# Patient Record
Sex: Female | Born: 1995 | Race: Black or African American | Hispanic: No | Marital: Single | Smoking: Never smoker
Health system: Southern US, Community
[De-identification: ages and names within clinical notes are randomized; demographics above are authoritative.]

## PROBLEM LIST (undated history)

## (undated) DIAGNOSIS — Z789 Other specified health status: Secondary | ICD-10-CM

## (undated) HISTORY — PX: NO PAST SURGERIES: SHX2092

---
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
## 2015-12-23 LAB — OB RESULTS CONSOLE VARICELLA ZOSTER ANTIBODY, IGG: VARICELLA IGG: IMMUNE

## 2015-12-23 LAB — OB RESULTS CONSOLE RUBELLA ANTIBODY, IGM: RUBELLA: IMMUNE

## 2015-12-23 LAB — OB RESULTS CONSOLE RPR: RPR: NONREACTIVE

## 2015-12-23 LAB — OB RESULTS CONSOLE HIV ANTIBODY (ROUTINE TESTING): HIV: NONREACTIVE

## 2016-03-29 LAB — OB RESULTS CONSOLE GC/CHLAMYDIA: CHLAMYDIA, DNA PROBE: POSITIVE

## 2016-05-15 NOTE — L&D Delivery Note (Signed)
Delivery Note At 4:13 PM a viable female was delivered via Vaginal, Spontaneous Delivery (Presentation:vertex ; LOA ).  APGAR:8 ,9 ; weight  .   Placenta status:spont ,via shultz .  Cord:3 vc  with the following complications:none .  Cord pH: n/a  Anesthesia:  none Episiotomy: None Lacerations: None Suture Repair: n/a Est. Blood Loss (mL): 100  Mom to postpartum.  Baby to Couplet care / Skin to Skin.  Wyvonnia DuskyMarie Tunisia Landgrebe 07/16/2016, 4:32 PM

## 2016-07-15 ENCOUNTER — Inpatient Hospital Stay (HOSPITAL_COMMUNITY): Payer: Medicaid Other

## 2016-07-15 ENCOUNTER — Encounter (HOSPITAL_COMMUNITY): Payer: Self-pay

## 2016-07-15 ENCOUNTER — Inpatient Hospital Stay (HOSPITAL_COMMUNITY)
Admission: AD | Admit: 2016-07-15 | Discharge: 2016-07-18 | DRG: 774 | Disposition: A | Discharge status: Home / Self Care | Payer: Medicaid Other | Source: Ambulatory Visit | Attending: Obstetrics and Gynecology | Admitting: Obstetrics and Gynecology

## 2016-07-15 DIAGNOSIS — O4693 Antepartum hemorrhage, unspecified, third trimester: Secondary | ICD-10-CM

## 2016-07-15 DIAGNOSIS — Z3A37 37 weeks gestation of pregnancy: Secondary | ICD-10-CM

## 2016-07-15 DIAGNOSIS — O9832 Other infections with a predominantly sexual mode of transmission complicating childbirth: Secondary | ICD-10-CM | POA: Diagnosis present

## 2016-07-15 DIAGNOSIS — O99824 Streptococcus B carrier state complicating childbirth: Secondary | ICD-10-CM | POA: Diagnosis present

## 2016-07-15 DIAGNOSIS — O4593 Premature separation of placenta, unspecified, third trimester: Principal | ICD-10-CM | POA: Diagnosis present

## 2016-07-15 DIAGNOSIS — A568 Sexually transmitted chlamydial infection of other sites: Secondary | ICD-10-CM | POA: Diagnosis present

## 2016-07-15 HISTORY — DX: Other specified health status: Z78.9

## 2016-07-15 LAB — URINALYSIS, ROUTINE W REFLEX MICROSCOPIC
BILIRUBIN URINE: NEGATIVE
Glucose, UA: NEGATIVE mg/dL
KETONES UR: NEGATIVE mg/dL
Nitrite: NEGATIVE
PH: 6 (ref 5.0–8.0)
Protein, ur: 30 mg/dL — AB
Specific Gravity, Urine: 1.027 (ref 1.005–1.030)

## 2016-07-15 LAB — TYPE AND SCREEN
ABO/RH(D): B POS
Antibody Screen: NEGATIVE

## 2016-07-15 LAB — CBC
HCT: 31.3 % — ABNORMAL LOW (ref 36.0–46.0)
Hemoglobin: 10.7 g/dL — ABNORMAL LOW (ref 12.0–15.0)
MCH: 30.7 pg (ref 26.0–34.0)
MCHC: 34.2 g/dL (ref 30.0–36.0)
MCV: 89.7 fL (ref 78.0–100.0)
Platelets: 192 10*3/uL (ref 150–400)
RBC: 3.49 MIL/uL — ABNORMAL LOW (ref 3.87–5.11)
RDW: 13 % (ref 11.5–15.5)
WBC: 7.2 10*3/uL (ref 4.0–10.5)

## 2016-07-15 LAB — WET PREP, GENITAL
Clue Cells Wet Prep HPF POC: NONE SEEN
SPERM: NONE SEEN
Trich, Wet Prep: NONE SEEN
YEAST WET PREP: NONE SEEN

## 2016-07-15 LAB — POCT FERN TEST: POCT FERN TEST: NEGATIVE

## 2016-07-15 MED ORDER — PENICILLIN G POTASSIUM 5000000 UNITS IJ SOLR
5.0000 10*6.[IU] | Freq: Once | INTRAVENOUS | Status: AC
  Administered 2016-07-15: 5 10*6.[IU] via INTRAVENOUS
  Filled 2016-07-15: qty 5

## 2016-07-15 MED ORDER — OXYTOCIN 40 UNITS IN LACTATED RINGERS INFUSION - SIMPLE MED: 2.5000 [IU]/h | INTRAVENOUS | Status: DC

## 2016-07-15 MED ORDER — ACETAMINOPHEN 325 MG PO TABS: 650.0000 mg | ORAL_TABLET | ORAL | Status: DC | PRN

## 2016-07-15 MED ORDER — SOD CITRATE-CITRIC ACID 500-334 MG/5ML PO SOLN: 30.0000 mL | ORAL | Status: DC | PRN

## 2016-07-15 MED ORDER — LACTATED RINGERS IV SOLN: 500.0000 mL | INTRAVENOUS | Status: DC | PRN

## 2016-07-15 MED ORDER — OXYTOCIN 40 UNITS IN LACTATED RINGERS INFUSION - SIMPLE MED
1.0000 m[IU]/min | INTRAVENOUS | Status: DC
  Administered 2016-07-15: 2 m[IU]/min via INTRAVENOUS
  Filled 2016-07-15: qty 1000

## 2016-07-15 MED ORDER — OXYCODONE-ACETAMINOPHEN 5-325 MG PO TABS: 1.0000 | ORAL_TABLET | ORAL | Status: DC | PRN

## 2016-07-15 MED ORDER — OXYCODONE-ACETAMINOPHEN 5-325 MG PO TABS: 2.0000 | ORAL_TABLET | ORAL | Status: DC | PRN

## 2016-07-15 MED ORDER — PENICILLIN G POT IN DEXTROSE 60000 UNIT/ML IV SOLN
3.0000 10*6.[IU] | INTRAVENOUS | Status: DC
  Administered 2016-07-16 (×4): 3 10*6.[IU] via INTRAVENOUS
  Filled 2016-07-15 (×7): qty 50

## 2016-07-15 MED ORDER — ONDANSETRON HCL 4 MG/2ML IJ SOLN: 4.0000 mg | Freq: Four times a day (QID) | INTRAMUSCULAR | Status: DC | PRN

## 2016-07-15 MED ORDER — TERBUTALINE SULFATE 1 MG/ML IJ SOLN
0.2500 mg | Freq: Once | INTRAMUSCULAR | Status: DC | PRN
  Filled 2016-07-15: qty 1

## 2016-07-15 MED ORDER — OXYTOCIN BOLUS FROM INFUSION
500.0000 mL | Freq: Once | INTRAVENOUS | Status: AC
  Administered 2016-07-16: 500 mL via INTRAVENOUS

## 2016-07-15 MED ORDER — LACTATED RINGERS IV SOLN
INTRAVENOUS | Status: DC
  Administered 2016-07-16: 06:00:00 via INTRAVENOUS

## 2016-07-15 MED ORDER — LIDOCAINE HCL (PF) 1 % IJ SOLN
30.0000 mL | INTRAMUSCULAR | Status: DC | PRN
  Filled 2016-07-15: qty 30

## 2016-07-15 NOTE — MAU Provider Note (Signed)
History     CSN: [id number]  Arrival date and time: 07/15/16 1831   First Provider Initiated Contact with Patient 07/15/16 1904      Chief Complaint  Patient presents with  . Abdominal Cramping  . Vaginal Bleeding   HPI Beth Dixon is a 21 y.o. G1P0 at [redacted]w[redacted]d who presents with vaginal bleeding. Reports vaginal bleeding like a period with some small clot that began ~1 hour PTA. Bleeding began immediately after intercourse. Gets prenatal care in [place] -- states her mother is en route with her prenatal records. Endorses some abdominal cramping & pelvic pressure with walking. Positive fetal movement. Denies complications with the pregnancy.   OB History    Gravida Para Term Preterm AB Living   1             SAB TAB Ectopic Multiple Live Births                  Past Medical History:  Diagnosis Date  . Medical history non-contributory     Past Surgical History:  Procedure Laterality Date  . NO PAST SURGERIES      History reviewed. No pertinent family history.  Social History  Substance Use Topics  . Smoking status: Never Smoker  . Smokeless tobacco: Never Used  . Alcohol use No    Allergies: No Known Allergies  Prescriptions Prior to Admission  Medication Sig Dispense Refill Last Dose  . cephALEXin (KEFLEX) 500 MG capsule Take 1 capsule (500 mg total) by mouth 4 (four) times daily. 40 capsule 0   . naproxen sodium (ANAPROX) 220 MG tablet Take 220 mg by mouth 2 (two) times daily as needed (for pain).   06/29/2014 at Unknown time  . ondansetron (ZOFRAN) 4 MG tablet Take 1 tablet (4 mg total) by mouth every 6 (six) hours. 12 tablet 0     Review of Systems  Constitutional: Negative.   Gastrointestinal: Positive for abdominal pain.  Genitourinary: Positive for vaginal bleeding. Negative for dyspareunia and dysuria.   Physical Exam   Blood pressure 128/82, pulse 98, temperature 98.6 F (37 C), temperature source Oral, resp. rate 18, height 5\' 3"  (1.6 m), weight 168  lb (76.2 kg).  Physical Exam  Nursing note and vitals reviewed. Constitutional: She is oriented to person, place, and time. She appears well-developed and well-nourished. No distress.  HENT:  Head: Normocephalic and atraumatic.  Eyes: Conjunctivae are normal. Right eye exhibits no discharge. Left eye exhibits no discharge. No scleral icterus.  Neck: Normal range of motion.  Respiratory: Effort normal. No respiratory distress.  GI: Soft. There is no tenderness.  Genitourinary: Cervix exhibits discharge (small amount of tan mucoid discharge) and friability. No bleeding in the vagina.  Genitourinary Comments: SVE deferred until ultrasound results  Neurological: She is alert and oriented to person, place, and time.  Skin: Skin is warm and dry. She is not diaphoretic.  Psychiatric: She has a normal mood and affect. Her behavior is normal. Judgment and thought content normal.   Fetal Tracing:  Baseline: 135 Variability: moderate Accelerations: 15x15 Decelerations: none  Toco: irr ctx MAU Course  Procedures Results for orders placed or performed during the hospital encounter of 07/15/16 (from the past 24 hour(s))  Urinalysis, Routine w reflex microscopic     Status: Abnormal   Collection Time: 07/15/16  6:38 PM  Result Value Ref Range   Color, Urine YELLOW YELLOW   APPearance HAZY (A) CLEAR   Specific Gravity, Urine 1.027 1.005 - 1.030  pH 6.0 5.0 - 8.0   Glucose, UA NEGATIVE NEGATIVE mg/dL   Hgb urine dipstick MODERATE (A) NEGATIVE   Bilirubin Urine NEGATIVE NEGATIVE   Ketones, ur NEGATIVE NEGATIVE mg/dL   Protein, ur 30 (A) NEGATIVE mg/dL   Nitrite NEGATIVE NEGATIVE   Leukocytes, UA LARGE (A) NEGATIVE   RBC / HPF 6-30 0 - 5 RBC/hpf   WBC, UA 6-30 0 - 5 WBC/hpf   Bacteria, UA RARE (A) NONE SEEN   Squamous Epithelial / LPF 6-30 (A) NONE SEEN   Mucous PRESENT   CBC     Status: Abnormal   Collection Time: 07/15/16  7:00 PM  Result Value Ref Range   WBC 7.2 4.0 - 10.5 K/uL    RBC 3.49 (L) 3.87 - 5.11 MIL/uL   Hemoglobin 10.7 (L) 12.0 - 15.0 g/dL   HCT 91.431.3 (L) 78.236.0 - 95.646.0 %   MCV 89.7 78.0 - 100.0 fL   MCH 30.7 26.0 - 34.0 pg   MCHC 34.2 30.0 - 36.0 g/dL   RDW 21.313.0 08.611.5 - 57.815.5 %   Platelets 192 150 - 400 K/uL  POCT fern test     Status: None   Collection Time: 07/15/16  7:36 PM  Result Value Ref Range   POCT Fern Test Negative = intact amniotic membranes     MDM CBC, type & screen, HIV, RPR, GC/CT, wet prep Pt in ultrasound to assess placenta Fern negative Care turned over to [facility] Lynleigh Kovack CNM            Judeth HornErin Lawrence, NP 07/15/2016 7:34 PM  2022- US nml, SVE 5/70/-2, reports worsening pain, likely early active labor, will admit Assessment and Plan  37.[redacted] weeks gestation GBS positive Admit to BS PCN  Donette LarryMelanie Cleven Jansma, CNM  07/15/2016 8:24 PM

## 2016-07-15 NOTE — MAU Note (Signed)
[place] in [place], onset of bleeding like a period about one hour ago, and cramping when walking.

## 2016-07-15 NOTE — H&P (Signed)
LABOR AND DELIVERY ADMISSION HISTORY AND PHYSICAL NOTE  Beth Dixon is a 21 y.o. female G1P0 with IUP at [redacted]w[redacted]d by LMP confirmed with Ultrasound presenting for vaginal bleeding. She presented to MAU with significant periord like bleeding for 1-2 hours following intercourse.This was associated with some cramping. Upon arrival to MAU she had an exam which revealed a friable cervix. dilated to 5cm Records where reviewed and they where unremarkable.   She reports positive fetal movement. She denies leakage of fluid or vaginal bleeding.  Prenatal History/Complications:  Past Medical History: Past Medical History:  Diagnosis Date  . Medical history non-contributory     Past Surgical History: Past Surgical History:  Procedure Laterality Date  . NO PAST SURGERIES      Obstetrical History: OB History    Gravida Para Term Preterm AB Living   1             SAB TAB Ectopic Multiple Live Births                  Social History: Social History   Social History  . Marital status: Single    Spouse name: N/A  . Number of children: N/A  . Years of education: N/A   Social History Main Topics  . Smoking status: Never Smoker  . Smokeless tobacco: Never Used  . Alcohol use No  . Drug use: No  . Sexual activity: Yes    Birth control/ protection: None   Other Topics Concern  . None   Social History Narrative  . None    Family History: History reviewed. No pertinent family history.  Allergies: No Known Allergies  Prescriptions Prior to Admission  Medication Sig Dispense Refill Last Dose  . cephALEXin (KEFLEX) 500 MG capsule Take 1 capsule (500 mg total) by mouth 4 (four) times daily. 40 capsule 0   . naproxen sodium (ANAPROX) 220 MG tablet Take 220 mg by mouth 2 (two) times daily as needed (for pain).   06/29/2014 at Unknown time  . ondansetron (ZOFRAN) 4 MG tablet Take 1 tablet (4 mg total) by mouth every 6 (six) hours. 12 tablet 0      Review of Systems   All systems  reviewed and negative except as stated in HPI  Blood pressure (!) 142/89, pulse 88, temperature 98.4 F (36.9 C), temperature source Oral, resp. rate 18, height 5\' 3"  (1.6 m), weight 168 lb (76.2 kg). General appearance: alert, cooperative and appears stated age Lungs: clear to auscultation bilaterally Heart: regular rate and rhythm Abdomen: soft, non-tender; bowel sounds normal Extremities: No calf swelling or tenderness Presentation: cephalic nursing exam Fetal monitoring: category 1  Uterine activity: irregular contractions Dilation: 5 Effacement (%): 70 Station: -2 Exam by:: Donette Larry CNM   Prenatal labs: ABO, Rh: --/--/B POS (03/03 1900) Antibody: NEG (03/03 1900) Rubella: immune RPR: Nonreactive (08/10 0000)  HBsAg:   negative HIV: Non-reactive (08/10 0000)  GBS:   positive 1 hr Glucola: normal Genetic screening:  noral Anatomy US: normal  Prenatal Transfer Tool  Maternal Diabetes: No Genetic Screening: Normal Maternal Ultrasounds/Referrals: Normal Fetal Ultrasounds or other Referrals:  None Maternal Substance Abuse:  No Significant Maternal Medications:  None Significant Maternal Lab Results: Lab values include: Group B Strep positive  Results for orders placed or performed during the hospital encounter of 07/15/16 (from the past 24 hour(s))  Urinalysis, Routine w reflex microscopic   Collection Time: 07/15/16  6:38 PM  Result Value Ref Range   Color, Urine  YELLOW YELLOW   APPearance HAZY (A) CLEAR   Specific Gravity, Urine 1.027 1.005 - 1.030   pH 6.0 5.0 - 8.0   Glucose, UA NEGATIVE NEGATIVE mg/dL   Hgb urine dipstick MODERATE (A) NEGATIVE   Bilirubin Urine NEGATIVE NEGATIVE   Ketones, ur NEGATIVE NEGATIVE mg/dL   Protein, ur 30 (A) NEGATIVE mg/dL   Nitrite NEGATIVE NEGATIVE   Leukocytes, UA LARGE (A) NEGATIVE   RBC / HPF 6-30 0 - 5 RBC/hpf   WBC, UA 6-30 0 - 5 WBC/hpf   Bacteria, UA RARE (A) NONE SEEN   Squamous Epithelial / LPF 6-30 (A) NONE  SEEN   Mucous PRESENT   CBC   Collection Time: 07/15/16  7:00 PM  Result Value Ref Range   WBC 7.2 4.0 - 10.5 K/uL   RBC 3.49 (L) 3.87 - 5.11 MIL/uL   Hemoglobin 10.7 (L) 12.0 - 15.0 g/dL   HCT 78.231.3 (L) 95.636.0 - 21.346.0 %   MCV 89.7 78.0 - 100.0 fL   MCH 30.7 26.0 - 34.0 pg   MCHC 34.2 30.0 - 36.0 g/dL   RDW 08.613.0 57.811.5 - 46.915.5 %   Platelets 192 150 - 400 K/uL  Type and screen   Collection Time: 07/15/16  7:00 PM  Result Value Ref Range   ABO/RH(D) B POS    Antibody Screen NEG    Sample Expiration 07/18/2016   Wet prep, genital   Collection Time: 07/15/16  7:14 PM  Result Value Ref Range   Yeast Wet Prep HPF POC NONE SEEN NONE SEEN   Trich, Wet Prep NONE SEEN NONE SEEN   Clue Cells Wet Prep HPF POC NONE SEEN NONE SEEN   WBC, Wet Prep HPF POC MODERATE (A) NONE SEEN   Sperm NONE SEEN   POCT fern test   Collection Time: 07/15/16  7:36 PM  Result Value Ref Range   POCT Fern Test Negative = intact amniotic membranes     Patient Active Problem List   Diagnosis Date Noted  . Indication for care in labor or delivery 07/15/2016    Assessment: Beth Dixon is a 21 y.o. G1P0 at 4261w4d here for Induction of labor for presumed abruption.  #Labor: will induce with pitocin. Patient with significant bleeding of unknown source. Concern for abruption. #Pain: Plans for epidural #FWB: Category 1 #ID:  GBS pos- PCN #MOF: breast #Circ:  outpatient  Ernestina Pennaicholas Jamael Hoffmann 07/15/2016, 11:41 PM

## 2016-07-16 ENCOUNTER — Encounter (HOSPITAL_COMMUNITY): Payer: Self-pay

## 2016-07-16 DIAGNOSIS — O4593 Premature separation of placenta, unspecified, third trimester: Secondary | ICD-10-CM

## 2016-07-16 DIAGNOSIS — O99824 Streptococcus B carrier state complicating childbirth: Secondary | ICD-10-CM

## 2016-07-16 DIAGNOSIS — Z3A37 37 weeks gestation of pregnancy: Secondary | ICD-10-CM

## 2016-07-16 LAB — HIV ANTIBODY (ROUTINE TESTING W REFLEX): HIV Screen 4th Generation wRfx: NONREACTIVE

## 2016-07-16 LAB — ABO/RH: ABO/RH(D): B POS

## 2016-07-16 LAB — RPR: RPR Ser Ql: NONREACTIVE

## 2016-07-16 MED ORDER — SODIUM CHLORIDE 0.9% FLUSH: 3.0000 mL | INTRAVENOUS | Status: DC | PRN

## 2016-07-16 MED ORDER — SIMETHICONE 80 MG PO CHEW: 80.0000 mg | CHEWABLE_TABLET | ORAL | Status: DC | PRN

## 2016-07-16 MED ORDER — ONDANSETRON HCL 4 MG PO TABS: 4.0000 mg | ORAL_TABLET | ORAL | Status: DC | PRN

## 2016-07-16 MED ORDER — ONDANSETRON HCL 4 MG/2ML IJ SOLN: 4.0000 mg | INTRAMUSCULAR | Status: DC | PRN

## 2016-07-16 MED ORDER — PRENATAL MULTIVITAMIN CH
1.0000 | ORAL_TABLET | Freq: Every day | ORAL | Status: DC
  Administered 2016-07-17: 1 via ORAL
  Filled 2016-07-16: qty 1

## 2016-07-16 MED ORDER — ZOLPIDEM TARTRATE 5 MG PO TABS: 5.0000 mg | ORAL_TABLET | Freq: Every evening | ORAL | Status: DC | PRN

## 2016-07-16 MED ORDER — TETANUS-DIPHTH-ACELL PERTUSSIS 5-2.5-18.5 LF-MCG/0.5 IM SUSP: 0.5000 mL | Freq: Once | INTRAMUSCULAR | Status: DC

## 2016-07-16 MED ORDER — NAPHAZOLINE-GLYCERIN 0.012-0.2 % OP SOLN: 2.0000 [drp] | Freq: Four times a day (QID) | OPHTHALMIC | Status: DC | PRN

## 2016-07-16 MED ORDER — DIPHENHYDRAMINE HCL 25 MG PO CAPS: 25.0000 mg | ORAL_CAPSULE | Freq: Four times a day (QID) | ORAL | Status: DC | PRN

## 2016-07-16 MED ORDER — BENZOCAINE-MENTHOL 20-0.5 % EX AERO
1.0000 "application " | INHALATION_SPRAY | CUTANEOUS | Status: DC | PRN
  Administered 2016-07-16 – 2016-07-18 (×2): 1 via TOPICAL
  Filled 2016-07-16: qty 728
  Filled 2016-07-16: qty 56

## 2016-07-16 MED ORDER — POLYVINYL ALCOHOL 1.4 % OP SOLN
2.0000 [drp] | OPHTHALMIC | Status: DC | PRN
  Administered 2016-07-16: 2 [drp] via OPHTHALMIC
  Filled 2016-07-16: qty 15

## 2016-07-16 MED ORDER — COCONUT OIL OIL
1.0000 "application " | TOPICAL_OIL | Status: DC | PRN
  Administered 2016-07-17: 1 via TOPICAL
  Filled 2016-07-16 (×2): qty 120

## 2016-07-16 MED ORDER — DIBUCAINE 1 % RE OINT: 1.0000 "application " | TOPICAL_OINTMENT | RECTAL | Status: DC | PRN

## 2016-07-16 MED ORDER — SODIUM CHLORIDE 0.9% FLUSH: 3.0000 mL | Freq: Two times a day (BID) | INTRAVENOUS | Status: DC

## 2016-07-16 MED ORDER — WITCH HAZEL-GLYCERIN EX PADS: 1.0000 "application " | MEDICATED_PAD | CUTANEOUS | Status: DC | PRN

## 2016-07-16 MED ORDER — MEASLES, MUMPS & RUBELLA VAC ~~LOC~~ INJ
0.5000 mL | INJECTION | Freq: Once | SUBCUTANEOUS | Status: DC
  Filled 2016-07-16: qty 0.5

## 2016-07-16 MED ORDER — SENNOSIDES-DOCUSATE SODIUM 8.6-50 MG PO TABS
2.0000 | ORAL_TABLET | ORAL | Status: DC
  Administered 2016-07-16 – 2016-07-17 (×2): 2 via ORAL
  Filled 2016-07-16 (×2): qty 2

## 2016-07-16 MED ORDER — ACETAMINOPHEN 325 MG PO TABS
650.0000 mg | ORAL_TABLET | ORAL | Status: DC | PRN
  Administered 2016-07-16 – 2016-07-17 (×2): 650 mg via ORAL
  Filled 2016-07-16 (×2): qty 2

## 2016-07-16 MED ORDER — SODIUM CHLORIDE 0.9 % IV SOLN: 250.0000 mL | INTRAVENOUS | Status: DC | PRN

## 2016-07-16 MED ORDER — IBUPROFEN 600 MG PO TABS
600.0000 mg | ORAL_TABLET | Freq: Four times a day (QID) | ORAL | Status: DC
  Administered 2016-07-16 – 2016-07-18 (×6): 600 mg via ORAL
  Filled 2016-07-16 (×8): qty 1

## 2016-07-16 NOTE — Progress Notes (Signed)
Patient seen and doing well. FHTs reviewed and category 1. Patient is getting more uncomfortable, but still not ready for an epidural. She has been using the birthing ball throughout the night. She has no other concerns. Her bleeding has been minimal. Continue to monitor and expect SVD.  Beth PennaNicholas Lamoyne Palencia, MD 07/16/16 [phone]

## 2016-07-16 NOTE — Progress Notes (Signed)
Beth Dixon is a 21 y.o. G1P0 at 4319w5d by ultrasound admitted for active labor  Subjective:   Objective: BP 129/86   Pulse 70   Temp 98.2 F (36.8 C) (Oral)   Resp 17   Ht 5\' 3"  (1.6 m)   Wt 168 lb (76.2 kg)   BMI 29.76 kg/m  No intake/output data recorded. No intake/output data recorded.  FHT:  FHR: 150 bpm, variability: moderate,  accelerations:  Present,  decelerations:  Present early UC:   regular, every 2 minutes SVE:   Dilation: 7 Effacement (%): 80, 70 Station: -1 Exam by:: Express Scriptsmber Knox, RN   Labs: Lab Results  Component Value Date   WBC 7.2 07/15/2016   HGB 10.7 (L) 07/15/2016   HCT 31.3 (L) 07/15/2016   MCV 89.7 07/15/2016   PLT 192 07/15/2016    Assessment / Plan: Augmentation of labor, progressing well  Labor: Progressing normally Preeclampsia:  no signs or symptoms of toxicity and intake and ouput balanced Fetal Wellbeing:  Category I Pain Control:  Labor support without medications I/D:  n/a Anticipated MOD:  NSVD  Beth Dixon 07/16/2016, 2:34 PM

## 2016-07-16 NOTE — Anesthesia Pain Management Evaluation Note (Signed)
  CRNA Pain Management Visit Note  Patient: Beth Dixon, 21 y.o., female  "Hello I am a member of the anesthesia team at [facility]. We have an anesthesia team available at all times to provide care throughout the hospital, including epidural management and anesthesia for C-section. I don't know your plan for the delivery whether it a natural birth, water birth, IV sedation, nitrous supplementation, doula or epidural, but we want to meet your pain goals."   1.Was your pain managed to your expectations on prior hospitalizations?   Yes   2.What is your expectation for pain management during this hospitalization?     Labor support without medications  3.How can we help you reach that goal? Be available  Record the patient's initial score and the patient's pain goal.   Pain: 3  Pain Goal: 5 [facility] wants you to be able to say your pain was always managed very well.  [facility] GarnerMERRITT,Jahkai Yandell 07/16/2016

## 2016-07-17 LAB — GC/CHLAMYDIA PROBE AMP (~~LOC~~) NOT AT ARMC
Chlamydia: POSITIVE — AB
NEISSERIA GONORRHEA: NEGATIVE

## 2016-07-17 MED ORDER — AZITHROMYCIN 250 MG PO TABS
1000.0000 mg | ORAL_TABLET | Freq: Once | ORAL | Status: AC
  Administered 2016-07-17: 1000 mg via ORAL
  Filled 2016-07-17: qty 4

## 2016-07-17 NOTE — Lactation Note (Signed)
This note was copied from a baby's chart. Lactation Consultation Note  Patient Name: Beth Dixon ZOXWR'UToday's Date: 07/17/2016 Reason for consult: Follow-up assessment Baby at 24 hr of life. Mom reports that bf has been gotten better today. She reports "some" nipple soreness when baby comes off the breast. No skin break down or bruising noted. Given coconut oil. Mom desires to pump so Dad can feed baby. Given Harmony. [facility] Discussed baby behavior, feeding frequency, baby belly size, voids, wt loss, breast changes, and nipple care. Demonstrated manual expression, colostrum noted bilaterally. Given lactation handouts. Aware of OP services and support group. Mom will bf on demand 8+/24hr.     Maternal Data Has patient been taught Hand Expression?: Yes Does the patient have breastfeeding experience prior to this delivery?: No  Feeding    LATCH Score/Interventions                      Lactation Tools Discussed/Used WIC Program: Yes Pump Review: Setup, frequency, and cleaning;Milk Storage Initiated by:: ES Date initiated:: 07/17/16   Consult Status Consult Status: Follow-up Date: 07/18/16 Follow-up type: In-patient    Beth Dixon 07/17/2016, 5:03 PM

## 2016-07-17 NOTE — Progress Notes (Signed)
Patient seen. Doing well post partum. Informed of positive Chlamydia status. Treated with 1g azithromycin.   Ernestina PennaNicholas Schenk, MD 07/17/16 [phone]

## 2016-07-17 NOTE — Progress Notes (Signed)
Post Partum Day #1 s/p SNVP, IOL at 37 weeks for presumed abruption Subjective: no complaints, up ad lib, voiding and tolerating PO  Objective: Blood pressure 109/60, pulse 76, temperature 98.2 F (36.8 C), temperature source Oral, resp. rate 18, height 5\' 3"  (1.6 m), weight 76.2 kg (168 lb), unknown if currently breastfeeding.  Physical Exam:  General: alert Lochia: appropriate Uterine Fundus: firm and NT at U-3 DVT Evaluation: No evidence of DVT seen on physical exam.   Recent Labs  07/15/16 1900  HGB 10.7*  HCT 31.3*    Assessment/Plan: Plan for discharge tomorrow   LOS: 2 days   Beth BossierMyra C Pearley Dixon 07/17/2016, 6:29 AM

## 2016-07-18 ENCOUNTER — Telehealth (HOSPITAL_COMMUNITY): Payer: Self-pay

## 2016-07-18 MED ORDER — IBUPROFEN 600 MG PO TABS: 600.0000 mg | ORAL_TABLET | Freq: Four times a day (QID) | ORAL | 0 refills | Status: AC

## 2016-07-18 MED ORDER — NORETHINDRONE 0.35 MG PO TABS: 1.0000 | ORAL_TABLET | Freq: Every day | ORAL | 11 refills | Status: AC

## 2016-07-18 NOTE — Telephone Encounter (Signed)
Pt is on MB at current time. I called Dr. Macon LargeAnyanwu to inform her that the pt was still in hospital and that I just received her lab results for +Chlam. Anyanwu said "okay. Thank you." While looking in the note, it said she was treated 07/17/2016 with Zithromax. Form Faxed to health dept.

## 2016-07-18 NOTE — Lactation Note (Signed)
This note was copied from a baby's chart. Lactation Consultation Note  Patient Name: Beth Jonathon ResidesLaDaja Alemany ZOXWR'UToday's Date: 07/18/2016  Mom states baby cluster fed last night.  She has pumped and obtained 15-20 mls which she is giving back to baby.  Reviewed basics and discharge teaching including engorgement treatment.  Reviewed waking techniques and breast massage during feeding.  Lactation outpatient services and support reviewed and encouraged.   Maternal Data    Feeding Feeding Type: Breast Fed Length of feed: 10 min  LATCH Score/Interventions                      Lactation Tools Discussed/Used Tools: Pump Breast pump type: Double-Electric Breast Pump Pump Review: Setup, frequency, and cleaning Initiated by:: [name] RN Date initiated:: 07/18/16   Consult Status      [name], [name] 07/18/2016, 8:53 AM

## 2016-07-18 NOTE — Discharge Summary (Signed)
Obstetric Discharge Summary Reason for Admission: presumptive abruption at 37 weeks, SOL, [place] Prenatal Procedures: ultrasound Intrapartum Procedures: spontaneous vaginal delivery Postpartum Procedures: treatment for Chlamydia with zithromax Complications-Operative and Postpartum: none Hemoglobin  Date Value Ref Range Status  07/15/2016 10.7 (L) 12.0 - 15.0 g/dL Final   HCT  Date Value Ref Range Status  07/15/2016 31.3 (L) 36.0 - 46.0 % Final    Physical Exam:  General: alert Lochia: appropriate Uterine Fundus: firm and NT at U-2 DVT Evaluation: No evidence of DVT seen on physical exam.  Discharge Diagnoses: 37+ week EGA delivered  Discharge Information: Date: 07/18/2016 Activity: pelvic rest Diet: routine Medications: PNV, Ibuprofen and Micronor Condition: stable Instructions: refer to practice specific booklet Discharge to: home Follow-up Information    Beth BossierMyra C Lindell Tussey, MD. Schedule an appointment as soon as possible for a visit in 6 week(s).   Specialty:  Obstetrics and Gynecology Contact information: [address] [place] [address] [phone]           Newborn Data: Live born female  Birth Weight: 6 lb 4.7 oz (2855 g) APGAR: 8, 9  Home with mother.  Beth BossierMyra C Krystal Dixon 07/18/2016, 6:53 AM

## 2016-07-18 NOTE — Discharge Instructions (Signed)
Chlamydia, Female Chlamydia is an STD (sexually transmitted disease). This is an infection that spreads through sexual contact. If it is not treated, it can cause serious problems. It must be treated with antibiotic medicine. Sometimes, you may not have symptoms (asymptomatic). When you have symptoms, they can include:  Burning when you pee (urinate).  Peeing often.  Fluid (discharge) coming from the vagina.  Redness, soreness, and swelling (inflammation) of the butt (rectum).  Bleeding or fluid coming from the butt.  Belly (abdominal) pain.  Pain during sex.  Bleeding between periods.  Itching, burning, or redness in the eyes.  Fluid coming from the eyes. Follow these instructions at home: Medicines   Take over-the-counter and prescription medicines only as told by your doctor.  Take your antibiotic medicine as told by your doctor. Do not stop taking the antibiotic even if you start to feel better. Sexual activity   Tell sex partners about your infection. Sex partners are people you had oral, anal, or vaginal sex with within 60 days of when you started getting sick. They need treatment, too.  Do not have sex until:  You and your sex partners have been treated.  Your doctor says it is okay.  If you have a single dose treatment, wait 7 days before having sex. General instructions   It is up to you to get your test results. Ask your doctor when your results will be ready.  Get a lot of rest.  Eat healthy foods.  Drink enough fluid to keep your pee (urine) clear or pale yellow.  Keep all follow-up visits as told by your doctor. You may need tests after 3 months. Preventing chlamydia   The only way to prevent chlamydia is not to have sex. To lower your risk:  Use latex condoms correctly. Do this every time you have sex.  Avoid having many sex partners.  Ask if your partner has been tested for STDs and if he or she had negative results. Contact a doctor  if:  You get new symptoms.  You do not get better with treatment.  You have a fever or chills.  You have pain during sex. Get help right away if:  Your pain gets worse and does not get better with medicine.  You get flu-like symptoms, such as:  Night sweats.  Sore throat.  Muscle aches.  You feel sick to your stomach (nauseous).  You throw up (vomit).  You have trouble swallowing.  You have bleeding:  Between periods.  After sex.  You have irregular periods.  You have belly pain that does not get better with medicine.  You have lower back pain that does not get better with medicine.  You feel weak or dizzy.  You pass out (faint).  You are pregnant and you get symptoms of chlamydia. Summary  Chlamydia is an infection that spreads through sexual contact.  Sometimes, chlamydia can cause no symptoms (asymptomatic).  Do not have sex until your doctor says it is okay.  All sex partners will have to be treated for chlamydia. This information is not intended to replace advice given to you by your health care provider. Make sure you discuss any questions you have with your health care provider. Document Released: 02/08/2008 Document Revised: 04/20/2016 Document Reviewed: 04/20/2016 Elsevier Interactive Patient Education  2017 [employer] Home Care Instructions for Mom  ACTIVITY  Gradually return to your regular activities.  Let yourself rest. Nap while your baby sleeps.  Avoid lifting anything that is  heavier than 10 lb (4.5 kg) until your health care provider says it is okay.  Avoid activities that take a lot of effort and energy (are strenuous) until approved by your health care provider. Walking at a slow-to-moderate pace is usually safe.  If you had a cesarean delivery:  Do not vacuum, climb stairs, or drive a car for 4-6 weeks.  Have someone help you at home until you feel like you can do your usual activities yourself.  Do exercises as told by  your health care provider, if this applies. VAGINAL BLEEDING You may continue to bleed for 4-6 weeks after delivery. Over time, the amount of blood usually decreases and the color of the blood usually gets lighter. However, the flow of bright red blood may increase if you have been too active. If you need to use more than one pad in an hour because your pad gets soaked, or if you pass a large clot:  Lie down.  Raise your feet.  Place a cold compress on your lower abdomen.  Rest.  Call your health care provider. If you are breastfeeding, your period should return anytime between 8 weeks after delivery and the time that you stop breastfeeding. If you are not breastfeeding, your period should return 6-8 weeks after delivery. PERINEAL CARE The perineal area, or perineum, is the part of your body between your thighs. After delivery, this area needs special care. Follow these instructions as told by your health care provider.  Take warm tub baths for 15-20 minutes.  Use medicated pads and pain-relieving sprays and creams as told.  Do not use tampons or douches until vaginal bleeding has stopped.  Each time you go to the bathroom:  Use a peri bottle.  Change your pad.  Use towelettes in place of toilet paper until your stitches have healed.  Do Kegel exercises every day. Kegel exercises help to maintain the muscles that support the vagina, bladder, and bowels. You can do these exercises while you are standing, sitting, or lying down. To do Kegel exercises:  Tighten the muscles of your abdomen and the muscles that surround your birth canal.  Hold for a few seconds.  Relax.  Repeat until you have done this 5 times in a row.  To prevent hemorrhoids from developing or getting worse:  Drink enough fluid to keep your urine clear or pale yellow.  Avoid straining when having a bowel movement.  Take over-the-counter medicines and stool softeners as told by your health care  provider. BREAST CARE  Wear a tight-fitting bra.  Avoid taking over-the-counter pain medicine for breast discomfort.  Apply ice to the breasts to help with discomfort as needed:  Put ice in a plastic bag.  Place a towel between your skin and the bag.  Leave the ice on for 20 minutes or as told by your health care provider. NUTRITION  Eat a well-balanced diet.  Do not try to lose weight quickly by cutting back on calories.  Take your prenatal vitamins until your postpartum checkup or until your health care provider tells you to stop. POSTPARTUM DEPRESSION You may find yourself crying for no apparent reason and unable to cope with all of the changes that come with having a newborn. This mood is called postpartum depression. Postpartum depression happens because your hormone levels change after delivery. If you have postpartum depression, get support from your partner, friends, and family. If the depression does not go away on its own after several weeks, contact your  health care provider. BREAST SELF-EXAM Do a breast self-exam each month, at the same time of the month. If you are breastfeeding, check your breasts just after a feeding, when your breasts are less full. If you are breastfeeding and your period has started, check your breasts on day 5, 6, or 7 of your period. Report any lumps, bumps, or discharge to your health care provider. Know that breasts are normally lumpy if you are breastfeeding. This is temporary, and it is not a health risk. INTIMACY AND SEXUALITY Avoid sexual activity for at least 3-4 weeks after delivery or until the brownish-red vaginal flow is completely gone. If you want to avoid pregnancy, use some form of birth control. You can get pregnant after delivery, even if you have not had your period. SEEK MEDICAL CARE IF:  You feel unable to cope with the changes that a child brings to your life, and these feelings do not go away after several weeks.  You notice a  lump, a bump, or discharge on your breast. SEEK IMMEDIATE MEDICAL CARE IF:  Blood soaks your pad in 1 hour or less.  You have:  Severe pain or cramping in your lower abdomen.  A bad-smelling vaginal discharge.  A fever that is not controlled by medicine.  A fever, and an area of your breast is red and sore.  Pain or redness in your calf.  Sudden, severe chest pain.  Shortness of breath.  Painful or bloody urination.  Problems with your vision.  You vomit for 12 hours or longer.  You develop a severe headache.  You have serious thoughts about hurting yourself, your child, or anyone else. This information is not intended to replace advice given to you by your health care provider. Make sure you discuss any questions you have with your health care provider. Document Released: 04/28/2000 Document Revised: 10/07/2015 Document Reviewed: 11/02/2014 Elsevier Interactive Patient Education  2017 [employer].

## 2018-09-09 IMAGING — US US MFM OB LIMITED
1 series · 12 of 12 positions shown · non-contrast
Comparison: none

[Series 1: us mfm ob limited · 12 acquisitions, 12 frames shown]
[im 1/12]
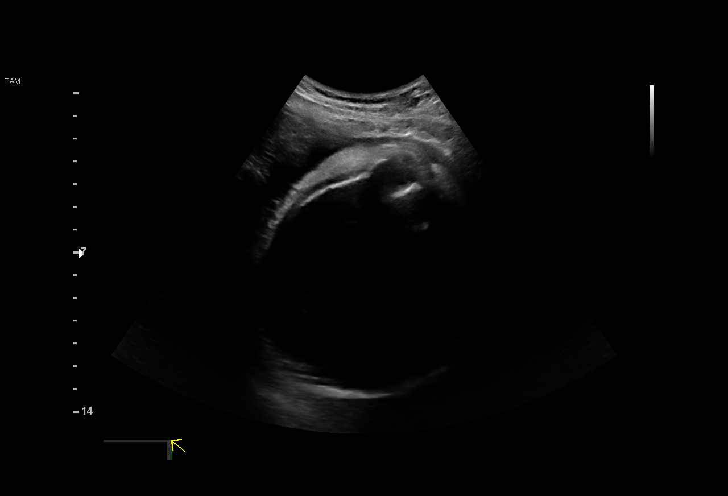
[im 2/12]
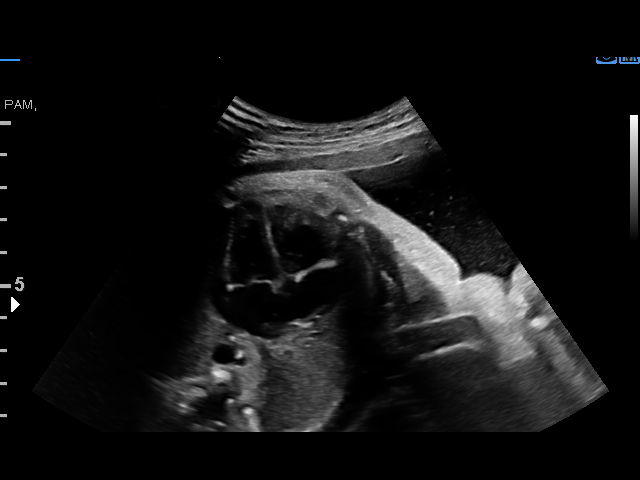
[im 3/12]
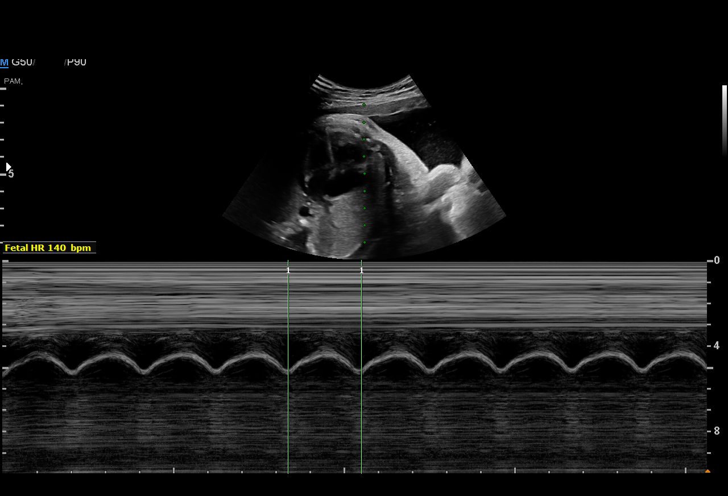
[im 4/12]
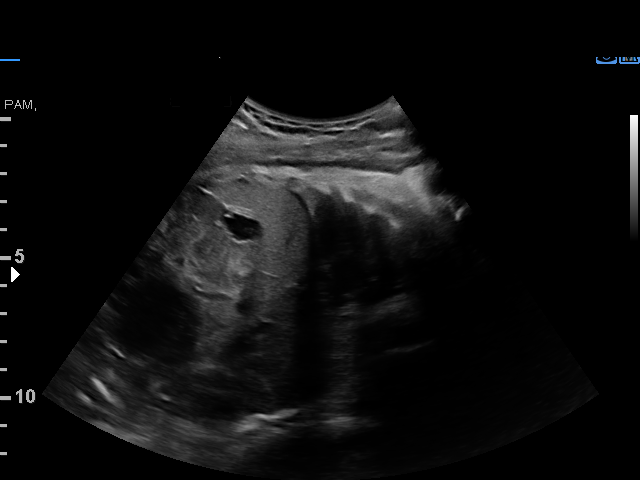
[im 5/12]
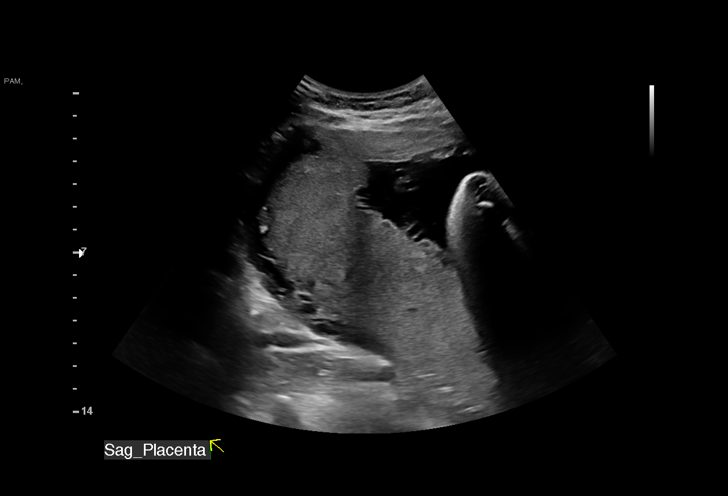
[im 6/12]
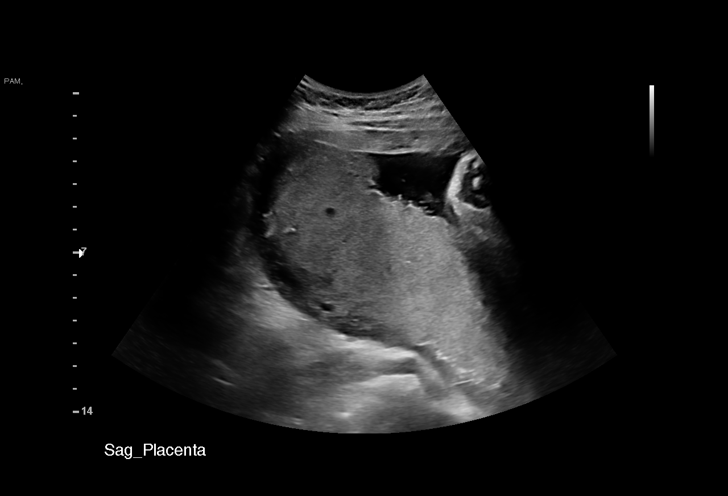
[im 7/12]
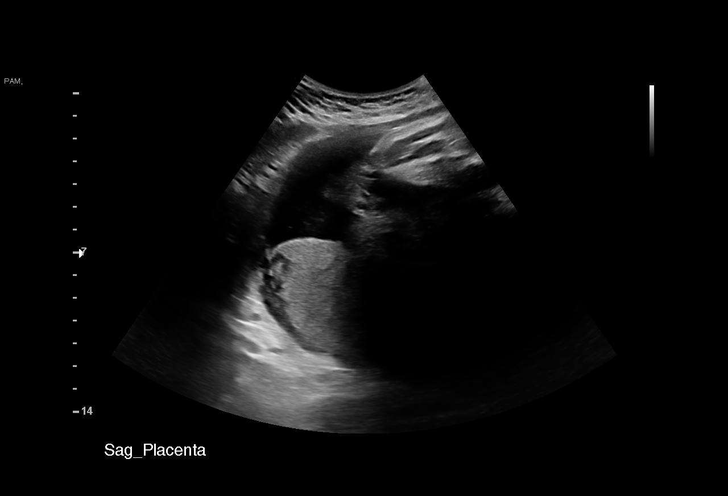
[im 8/12]
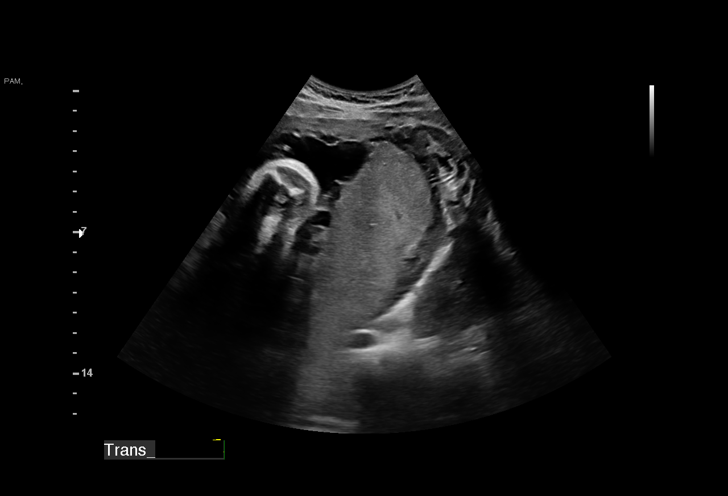
[im 9/12]
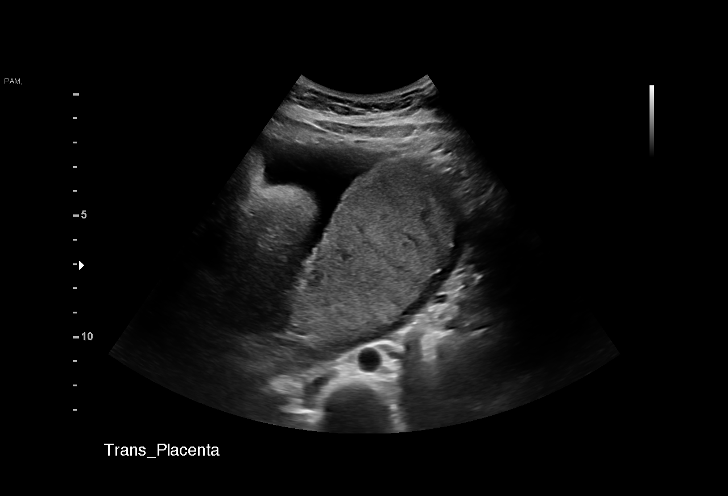
[im 10/12]
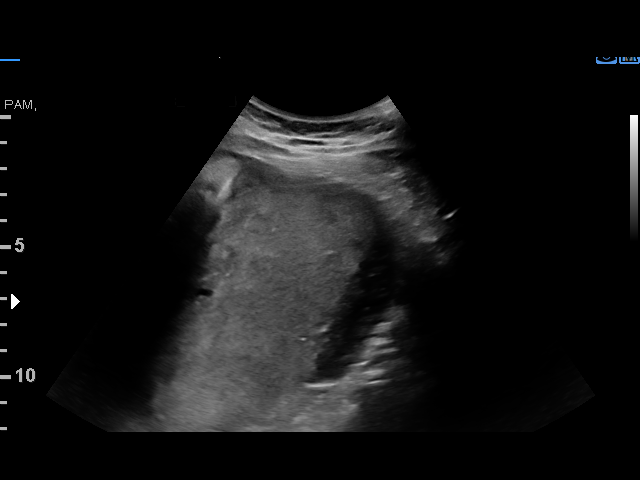
[im 11/12]
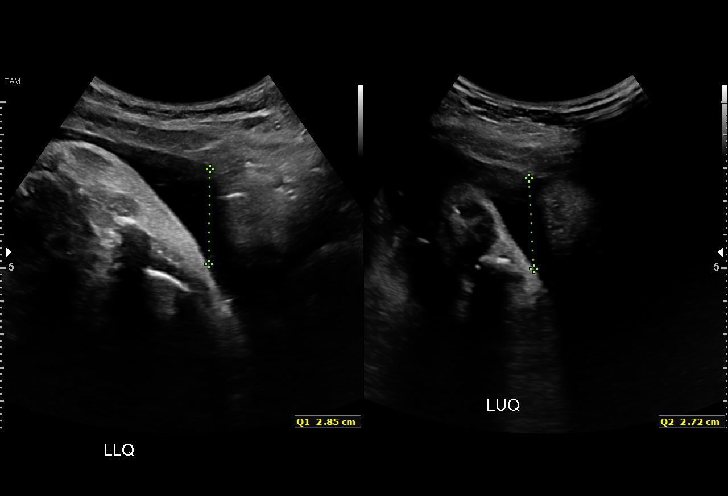
[im 12/12]
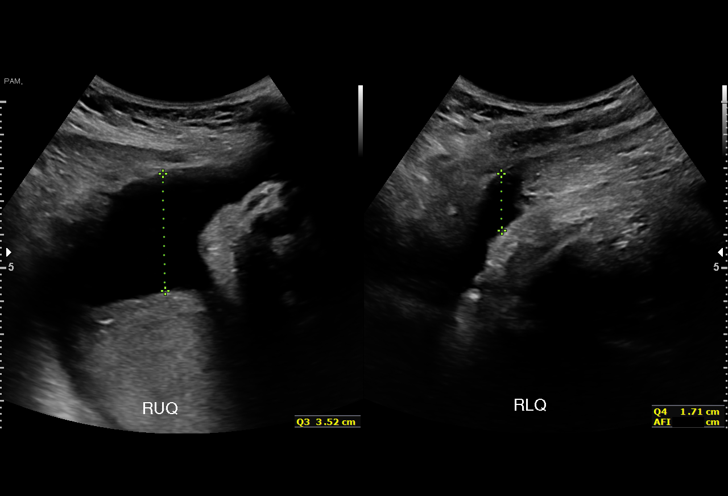

[12 of 12 positions shown; findings below may reference images not displayed]

Indications

37 weeks gestation of pregnancy
Vaginal bleeding in pregnancy, third trimester
OB History

Gravidity:    1
Living:       0
Fetal Evaluation

Num Of Fetuses:     1
Fetal Heart         140
Rate(bpm):
Cardiac Activity:   Observed
Presentation:       Cephalic
Placenta:           Posterior Fundal, above cervical os

Amniotic Fluid
AFI FV:      Subjectively within normal limits

AFI Sum(cm)     %Tile       Largest Pocket(cm)
10.8            31

RUQ(cm)       RLQ(cm)       LUQ(cm)        LLQ(cm)
2.85
Gestational Age

Clinical EDD:  37w 4d                                        EDD:   08/01/16
Best:          37w 4d     Det. By:  Clinical EDD             EDD:   08/01/16
Impression

Single IUP at 37w 3d
Limited ultraosund preformed due to vaginal bleeding
Cephalic presentation
Posterior fundal placenta without previa
No sonographic findings suggestive of abruption noted
Normal amniotic fluid volume
Recommendations

Follow up ultrasounds as clinically indicated
